# Patient Record
Sex: Male | Born: 1970 | Race: White | Hispanic: No | Marital: Married | Smoking: Never smoker
Health system: Southern US, Community
[De-identification: ages and names within clinical notes are randomized; demographics above are authoritative.]

---

## 2018-03-10 ENCOUNTER — Emergency Department (HOSPITAL_COMMUNITY)
Admission: EM | Admit: 2018-03-10 | Discharge: 2018-03-10 | Disposition: A | Discharge status: Home / Self Care | Payer: Self-pay | Attending: Emergency Medicine | Admitting: Emergency Medicine

## 2018-03-10 ENCOUNTER — Encounter (HOSPITAL_COMMUNITY): Payer: Self-pay | Admitting: *Deleted

## 2018-03-10 ENCOUNTER — Emergency Department (HOSPITAL_COMMUNITY): Payer: Self-pay

## 2018-03-10 DIAGNOSIS — K5900 Constipation, unspecified: Secondary | ICD-10-CM

## 2018-03-10 MED ORDER — MINERAL OIL RE ENEM: 1.0000 | ENEMA | Freq: Once | RECTAL | 0 refills | Status: AC

## 2018-03-10 NOTE — ED Provider Notes (Signed)
[facility] Provider Note   CSN: [id number] Arrival date & time: 03/10/18  1131     History   Chief Complaint Chief Complaint  Patient presents with  . Constipation    HPI Jeremiah [facility] is a 47 y.o. male presenting today for concern of constipation.  Patient states that for the past 6 days he has had decrease in bowel movements.  Patient admits to having small hard round stools intermittently over the past 6 days, once every other day, however this is decreased from his normal amount.  Patient states that when he tried to use the bathroom this morning he had a large amount of flatulence.   Patient denies associated pain, nausea or vomiting.  Patient states that he has still been eating well and has a strong appetite.  Last oral intake was lunch today.  Patient has been taking over-the-counter Dulcolax for the past 4-5 days without relief of his symptoms.  HPI  History reviewed. No pertinent past medical history.  There are no active problems to display for this patient.   History reviewed. No pertinent surgical history.      Home Medications    Prior to Admission medications   Medication Sig Start Date End Date Taking? Authorizing Provider  mineral oil enema Place 133 mLs (1 enema total) rectally once for 1 dose. 03/10/18 03/10/18  Bill SalinasMorelli, Leslee Suire A, PA-C    Family History No family history on file.  Social History Social History   Tobacco Use  . Smoking status: Never Smoker  . Smokeless tobacco: Never Used  Substance Use Topics  . Alcohol use: Not Currently  . Drug use: Not Currently     Allergies   Codeine   Review of Systems Review of Systems  Constitutional: Negative.  Negative for chills and fever.  Respiratory: Negative.  Negative for cough and shortness of breath.   Cardiovascular: Negative.  Negative for chest pain.  Gastrointestinal: Positive for constipation. Negative for abdominal distention, abdominal  pain, blood in stool, diarrhea, nausea and vomiting.  Genitourinary: Negative.  Negative for difficulty urinating, dysuria and hematuria.  Neurological: Negative.  Negative for weakness and headaches.  All other systems reviewed and are negative.  Physical Exam Updated Vital Signs BP (!) 129/94   Pulse (!) 55   Temp 98 F (36.7 C) (Oral)   Resp 16   Ht 5\' 10"  (1.778 m)   Wt 88.5 kg   SpO2 99%   BMI 27.98 kg/m   Physical Exam  Constitutional: He appears well-developed and well-nourished. No distress.  HENT:  Head: Normocephalic and atraumatic.  Right Ear: External ear normal.  Left Ear: External ear normal.  Nose: Nose normal.  Mouth/Throat: Uvula is midline, oropharynx is clear and moist and mucous membranes are normal.  Eyes: Pupils are equal, round, and reactive to light. Conjunctivae and EOM are normal.  Neck: Trachea normal, normal range of motion, full passive range of motion without pain and phonation normal. Neck supple. No tracheal deviation present.  Cardiovascular: Normal rate, regular rhythm, normal heart sounds and intact distal pulses.  Pulmonary/Chest: Effort normal and breath sounds normal. No respiratory distress. He exhibits no tenderness, no crepitus and no deformity.  Abdominal: Soft. Normal appearance and bowel sounds are normal. He exhibits no distension. There is no tenderness. There is no rigidity, no rebound, no guarding, no CVA tenderness, no tenderness at McBurney's point and negative Murphy's sign.  Genitourinary:  Genitourinary Comments: Deferred by patient  Musculoskeletal: Normal range  of motion.  Neurological: He is alert. GCS eye subscore is 4. GCS verbal subscore is 5. GCS motor subscore is 6.  Speech is clear and goal oriented, follows commands Major Cranial nerves without deficit, no facial droop Moves extremities without ataxia, coordination intact Normal gait  Skin: Skin is warm and dry.  Psychiatric: He has a normal mood and affect. His  behavior is normal.   ED Treatments / Results  Labs (all labs ordered are listed, but only abnormal results are displayed) Labs Reviewed - No data to display  EKG None  Radiology Dg Abdomen 1 View  Result Date: 03/10/2018 CLINICAL DATA:  Constipation for 1 week.  Abdominal discomfort. EXAM: ABDOMEN - 1 VIEW COMPARISON:  None. FINDINGS: The bowel gas pattern is normal. Moderate amount of formed stool throughout the colon. No radio-opaque calculi or other significant radiographic abnormality are seen. IMPRESSION: Negative. Electronically Signed   By: Ted Mcalpine M.D.   On: 03/10/2018 15:05    Procedures Procedures (including critical care time)  Medications Ordered in ED Medications - No data to display   Initial Impression / Assessment and Plan / ED Course  I have reviewed the triage vital signs and the nursing notes.  Pertinent labs & imaging results that were available during my care of the patient were reviewed by me and considered in my medical decision making (see chart for details).    [facility] is a 47 y.o. male who presents to ED for 6 days of constipation. Reassuring abdominal exam, no localizing symptoms, negative rebound, patient afebrile.  Patient is still passing gas, states that he had bowel movement yesterday, small/hard balls.  KUB today shows no acute findings.  Otherwise healthy and well-appearing. No signs of obstruction of other complications. Discussed home laxative therapy to help relieve symptoms as well as increasing hydration and following high fiber diet.  Patient given Fleet enema to perform at home.  Prescribed MiraLAX as well.  Patient advised to follow-up with PCP if symptoms persist.   Afebrile, not tachycardic, not hypotensive, well-appearing in no acute distress.  At this time there does not appear to be any evidence of an acute emergency medical condition and the patient appears stable for discharge with appropriate outpatient follow up.  Diagnosis was discussed with patient who verbalizes understanding of care plan and is agreeable to discharge. I have discussed return precautions with patient who verbalizes understanding of return precautions. Patient strongly encouraged to follow-up with their PCP within one week. All questions answered.  Patient's case discussed with Dr. Adriana Simas who agrees with plan to discharge with follow-up.   Note: Portions of this report may have been transcribed using voice recognition software. Every effort was made to ensure accuracy; however, inadvertent computerized transcription errors may still be present.  Final Clinical Impressions(s) / ED Diagnoses   Final diagnoses:  Constipation, unspecified constipation type    ED Discharge Orders         Ordered    mineral oil enema   Once     03/10/18 1519           Elizabeth Palau 03/10/18 1617    Donnetta Hutching, MD 03/11/18 1439

## 2018-03-10 NOTE — ED Triage Notes (Signed)
Pt in c/o no BM in 6 days, denies vomiting, reports nausea after taking a Laxative, pt denies taking prescribed pain meds, A&O x4, no significant med hx reported

## 2018-03-10 NOTE — Discharge Instructions (Signed)
You have been diagnosed today with Constipation.  At this time there does not appear to be the presence of an emergent medical condition, however there is always the potential for conditions to change. Please read and follow the below instructions.  Please return to the Emergency Department immediately for any new or worsening symptoms or if your symptoms do not improve. Please be sure to follow up with your Primary Care Provider as soon as possible regarding your visit today; please call their office to schedule an appointment even if you are feeling better for a follow-up visit. Please drink plenty of water to help with your constipation.  You may use over-the-counter remedies such as Fleet enemas, MiraLAX, high-fiber diet to help with your constipation.  Get help right away if: You have a fever and your symptoms suddenly get worse. You leak stool or have blood in your stool. Your abdomen is bloated. You have severe pain in your abdomen. You feel dizzy or you faint. You stop passing gas You have fever You vomit  Please read the additional information packets attached to your discharge summary.  Do not take your medicine if  develop an itchy rash, swelling in your mouth or lips, or difficulty breathing.

## 2020-03-18 IMAGING — DX DG ABDOMEN 1V
1 series · 1 of 1 positions shown · non-contrast
Comparison: None.

CLINICAL DATA: Constipation for 1 week.  Abdominal discomfort.

EXAM:
ABDOMEN - 1 VIEW

[abdomen kub]
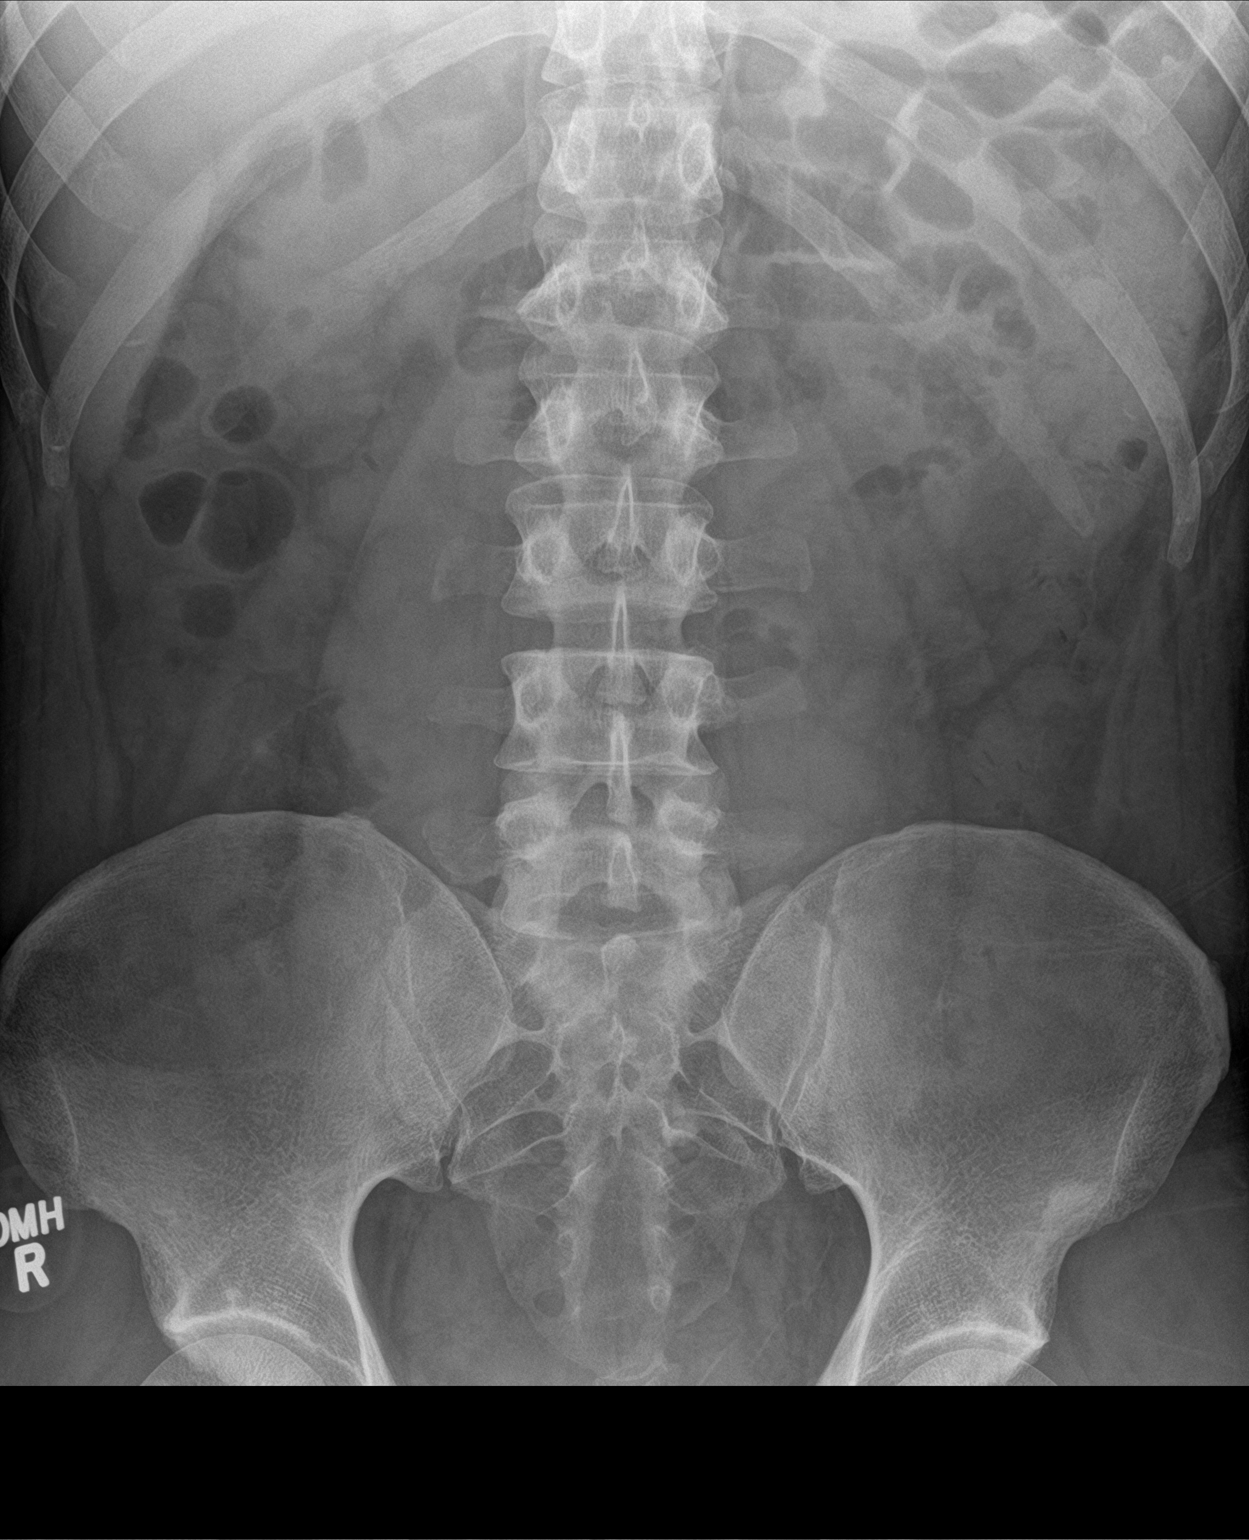

[1 of 1 positions shown; findings below may reference images not displayed]

FINDINGS: The bowel gas pattern is normal. Moderate amount of formed stool
throughout the colon. No radio-opaque calculi or other significant
radiographic abnormality are seen.
IMPRESSION: Negative.
